# Patient Record
Sex: Female | Born: 1960 | Race: Black or African American | Hispanic: No | Smoking: Former smoker
Health system: Southern US, Community
[De-identification: ages and names within clinical notes are randomized; demographics above are authoritative.]

---

## 2010-04-06 ENCOUNTER — Encounter: Admission: RE | Admit: 2010-04-06 | Discharge: 2010-05-20 | Payer: Self-pay | Admitting: Orthopedic Surgery

## 2010-06-28 ENCOUNTER — Encounter: Payer: Self-pay | Admitting: Orthopedic Surgery

## 2010-06-30 ENCOUNTER — Inpatient Hospital Stay (HOSPITAL_COMMUNITY): Admission: RE | Admit: 2010-06-30 | Discharge: 2010-07-01 | Payer: Self-pay | Admitting: *Deleted

## 2010-11-02 LAB — COMPREHENSIVE METABOLIC PANEL
ALT: 70 U/L — ABNORMAL HIGH (ref 0–35)
AST: 29 U/L (ref 0–37)
Alkaline Phosphatase: 145 U/L — ABNORMAL HIGH (ref 39–117)
CO2: 29 mEq/L (ref 19–32)
Chloride: 108 mEq/L (ref 96–112)
GFR calc Af Amer: >60 mL/min (ref >60)
GFR calc non Af Amer: 56 mL/min — ABNORMAL LOW (ref >60)
Potassium: 3.8 mEq/L (ref 3.5–5.1)
Sodium: 143 mEq/L (ref 135–145)
Total Bilirubin: 0.4 mg/dL (ref 0.3–1.2)

## 2010-11-02 LAB — HEPATIC FUNCTION PANEL
Indirect Bilirubin: 0.3 mg/dL (ref 0.3–0.9)
Total Protein: 5.9 g/dL — ABNORMAL LOW (ref 6.0–8.3)

## 2010-11-02 LAB — SURGICAL PCR SCREEN
MRSA, PCR: NEGATIVE
Staphylococcus aureus: NEGATIVE

## 2010-11-02 LAB — DIFFERENTIAL
Basophils Absolute: 0 10*3/uL (ref 0.0–0.1)
Eosinophils Absolute: 0.4 10*3/uL (ref 0.0–0.7)
Eosinophils Relative: 6 % — ABNORMAL HIGH (ref 0–5)
Monocytes Absolute: 0.5 10*3/uL (ref 0.1–1.0)

## 2010-11-02 LAB — URINALYSIS, ROUTINE W REFLEX MICROSCOPIC
Ketones, ur: NEGATIVE mg/dL
Nitrite: NEGATIVE
Specific Gravity, Urine: 1.031 — ABNORMAL HIGH (ref 1.005–1.030)
pH: 5.5 (ref 5.0–8.0)

## 2010-11-02 LAB — CBC
Hemoglobin: 12.3 g/dL (ref 12.0–15.0)
MCH: 28.7 pg (ref 26.0–34.0)
MCV: 89.5 fL (ref 78.0–100.0)
Platelets: 177 10*3/uL (ref 150–400)
RBC: 3.14 MIL/uL — ABNORMAL LOW (ref 3.87–5.11)
RBC: 4.29 MIL/uL (ref 3.87–5.11)
WBC: 7 10*3/uL (ref 4.0–10.5)
WBC: 7.7 10*3/uL (ref 4.0–10.5)

## 2010-11-02 LAB — TYPE AND SCREEN: Antibody Screen: NEGATIVE

## 2010-11-02 LAB — POCT I-STAT 4, (NA,K, GLUC, HGB,HCT): Glucose, Bld: 104 mg/dL — ABNORMAL HIGH (ref 70–99)

## 2010-11-02 LAB — APTT: aPTT: 28 seconds (ref 24–37)

## 2010-11-02 LAB — PROTIME-INR: Prothrombin Time: 14 seconds (ref 11.6–15.2)

## 2010-11-15 ENCOUNTER — Emergency Department (HOSPITAL_COMMUNITY)
Admission: EM | Admit: 2010-11-15 | Discharge: 2010-11-15 | Disposition: A | Discharge status: Home / Self Care | Payer: Medicaid Other | Attending: General Surgery | Admitting: General Surgery

## 2010-11-15 ENCOUNTER — Emergency Department (HOSPITAL_COMMUNITY): Payer: Medicaid Other

## 2010-11-15 DIAGNOSIS — S21209A Unspecified open wound of unspecified back wall of thorax without penetration into thoracic cavity, initial encounter: Secondary | ICD-10-CM | POA: Insufficient documentation

## 2010-11-15 DIAGNOSIS — S0003XA Contusion of scalp, initial encounter: Secondary | ICD-10-CM | POA: Insufficient documentation

## 2010-11-15 DIAGNOSIS — S1190XA Unspecified open wound of unspecified part of neck, initial encounter: Secondary | ICD-10-CM | POA: Insufficient documentation

## 2010-11-15 DIAGNOSIS — S0083XA Contusion of other part of head, initial encounter: Secondary | ICD-10-CM | POA: Insufficient documentation

## 2010-11-15 DIAGNOSIS — S0990XA Unspecified injury of head, initial encounter: Secondary | ICD-10-CM | POA: Insufficient documentation

## 2010-11-15 LAB — ABO/RH: ABO/RH(D): A POS

## 2010-11-15 LAB — COMPREHENSIVE METABOLIC PANEL
ALT: 20 U/L (ref 0–35)
Alkaline Phosphatase: 141 U/L — ABNORMAL HIGH (ref 39–117)
BUN: 18 mg/dL (ref 6–23)
CO2: 28 meq/L (ref 19–32)
Chloride: 102 meq/L (ref 96–112)
Glucose, Bld: 102 mg/dL — ABNORMAL HIGH (ref 70–99)
Potassium: 3.5 meq/L (ref 3.5–5.1)
Total Bilirubin: 0.4 mg/dL (ref 0.3–1.2)

## 2010-11-15 LAB — CBC
HCT: 37.4 % (ref 36.0–46.0)
MCH: 27 pg (ref 26.0–34.0)
MCHC: 32.6 g/dL (ref 30.0–36.0)
MCV: 82.7 fL (ref 78.0–100.0)
RDW: 15.9 % — ABNORMAL HIGH (ref 11.5–15.5)

## 2010-11-15 LAB — POCT I-STAT, CHEM 8
BUN: 20 mg/dL (ref 6–23)
Calcium, Ion: 1.12 mmol/L (ref 1.12–1.32)
Chloride: 103 meq/L (ref 96–112)
Creatinine, Ser: 1.3 mg/dL — ABNORMAL HIGH (ref 0.4–1.2)
Glucose, Bld: 96 mg/dL (ref 70–99)
HCT: 39 % (ref 36.0–46.0)

## 2010-11-16 LAB — TYPE AND SCREEN
ABO/RH(D): A POS
Antibody Screen: NEGATIVE
Unit division: 0

## 2010-11-18 ENCOUNTER — Other Ambulatory Visit: Payer: Self-pay | Admitting: General Surgery

## 2010-11-18 DIAGNOSIS — E079 Disorder of thyroid, unspecified: Secondary | ICD-10-CM
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
## 2010-11-24 ENCOUNTER — Ambulatory Visit
Admission: RE | Admit: 2010-11-24 | Discharge: 2010-11-24 | Disposition: A | Discharge status: Home / Self Care | Payer: No Typology Code available for payment source | Source: Ambulatory Visit | Attending: General Surgery | Admitting: General Surgery

## 2010-11-24 DIAGNOSIS — E079 Disorder of thyroid, unspecified: Secondary | ICD-10-CM

## 2010-12-03 ENCOUNTER — Other Ambulatory Visit: Payer: Self-pay | Admitting: General Surgery

## 2010-12-03 DIAGNOSIS — E079 Disorder of thyroid, unspecified: Secondary | ICD-10-CM

## 2010-12-07 ENCOUNTER — Other Ambulatory Visit (HOSPITAL_COMMUNITY)
Admission: RE | Admit: 2010-12-07 | Discharge: 2010-12-07 | Disposition: A | Discharge status: Home / Self Care | Payer: Medicaid Other | Source: Ambulatory Visit | Attending: Interventional Radiology | Admitting: Interventional Radiology

## 2010-12-07 ENCOUNTER — Other Ambulatory Visit: Payer: Self-pay | Admitting: Interventional Radiology

## 2010-12-07 ENCOUNTER — Ambulatory Visit
Admission: RE | Admit: 2010-12-07 | Discharge: 2010-12-07 | Disposition: A | Discharge status: Home / Self Care | Payer: No Typology Code available for payment source | Source: Ambulatory Visit | Attending: General Surgery | Admitting: General Surgery

## 2010-12-07 DIAGNOSIS — E079 Disorder of thyroid, unspecified: Secondary | ICD-10-CM

## 2010-12-07 DIAGNOSIS — E049 Nontoxic goiter, unspecified: Secondary | ICD-10-CM | POA: Insufficient documentation
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
## 2013-02-03 IMAGING — US US THYROID BIOPSY
1 series · 6 of 6 positions shown · non-contrast
Comparison: none

CLINICAL DATA: Dominant right thyroid cyst.

ULTRASOUND-GUIDED THYROID ASPIRATION BIOPSY
TECHNIQUE: Survey ultrasound was performed and the dominant lesion
in the inferior right lobe was localized.  An appropriate skin
entry site was determined.  Skin was marked, then prepped with
Betadine, draped in usual sterile fashion, and infiltrated locally
with 1% lidocaine.  Under real-time ultrasound guidance, 4  passes
were made into the lesion with 18 and 25 gauge needles sampling
solid and cystic components.  The patient tolerated procedure well,
with no immediate complications.
IMPRESSION
1.  Technically successful ultrasound-guided thyroid aspiration
biopsy

[Series 1: us thyroid biopsy · 0.08mm/px · 6 acquisitions, 6 frames shown]
[im 1/6]
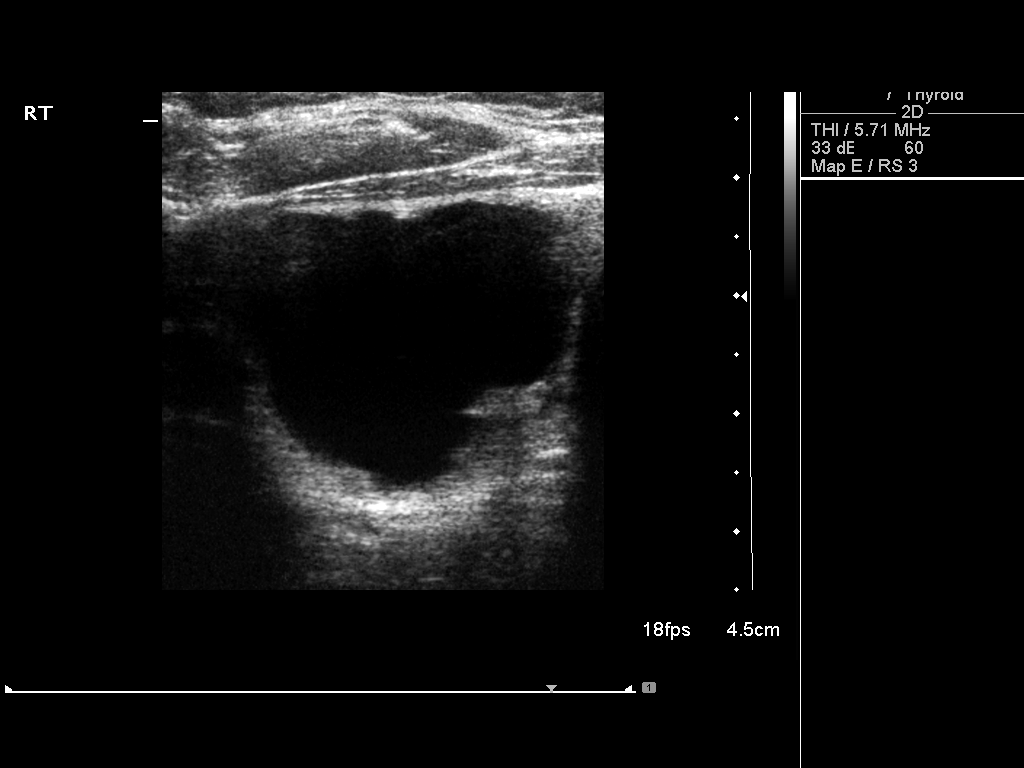
[im 2/6]
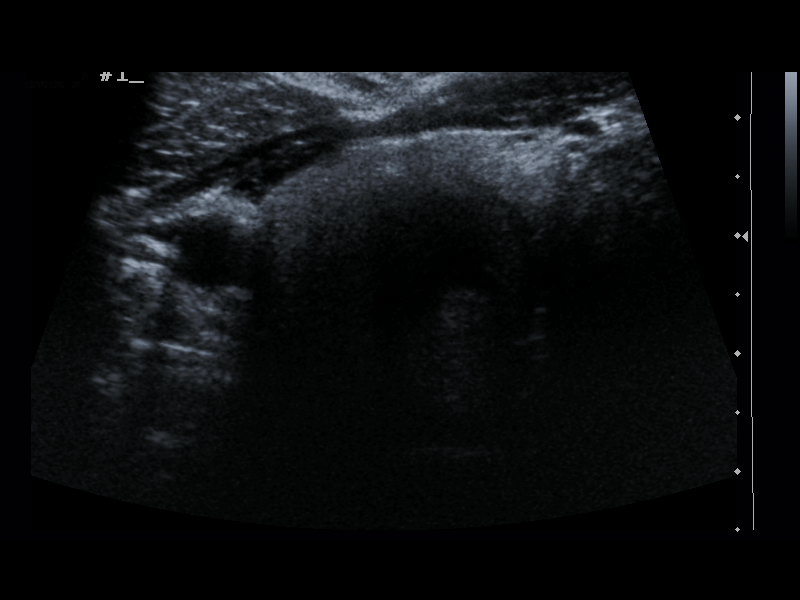
[im 3/6]
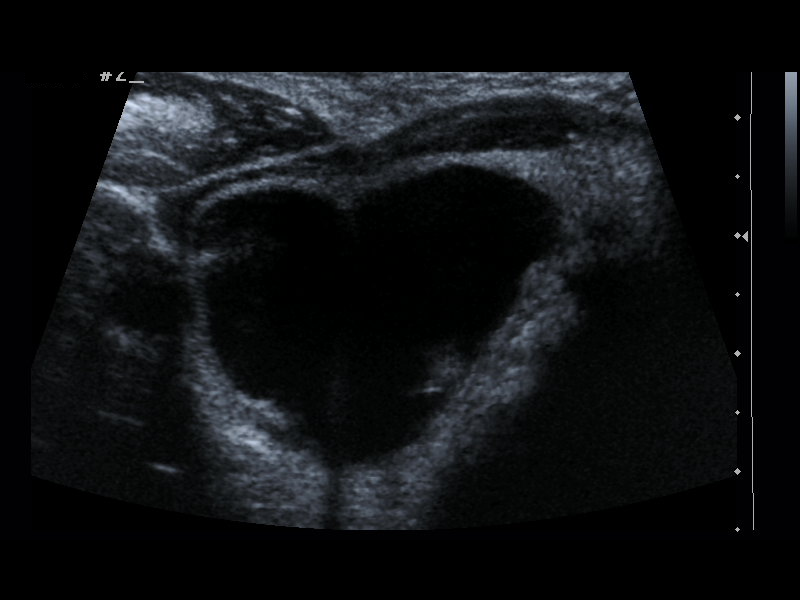
[im 4/6]
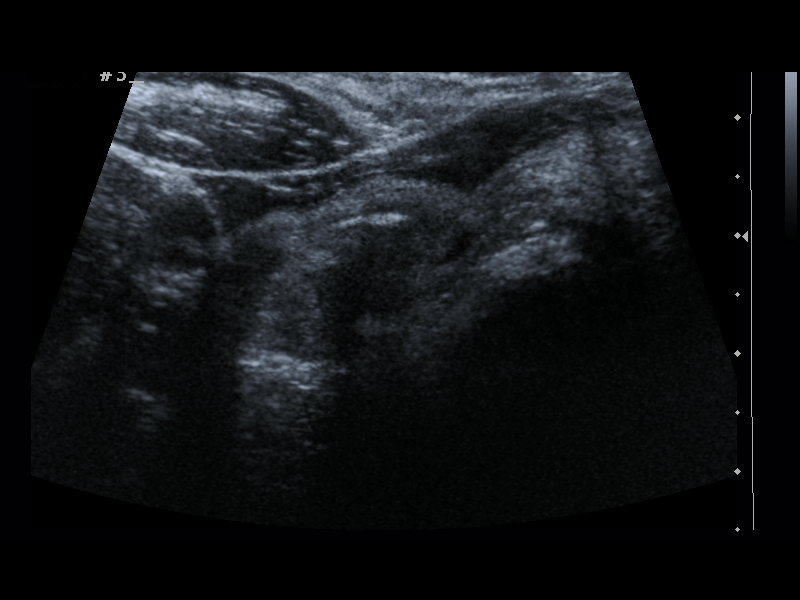
[im 5/6]
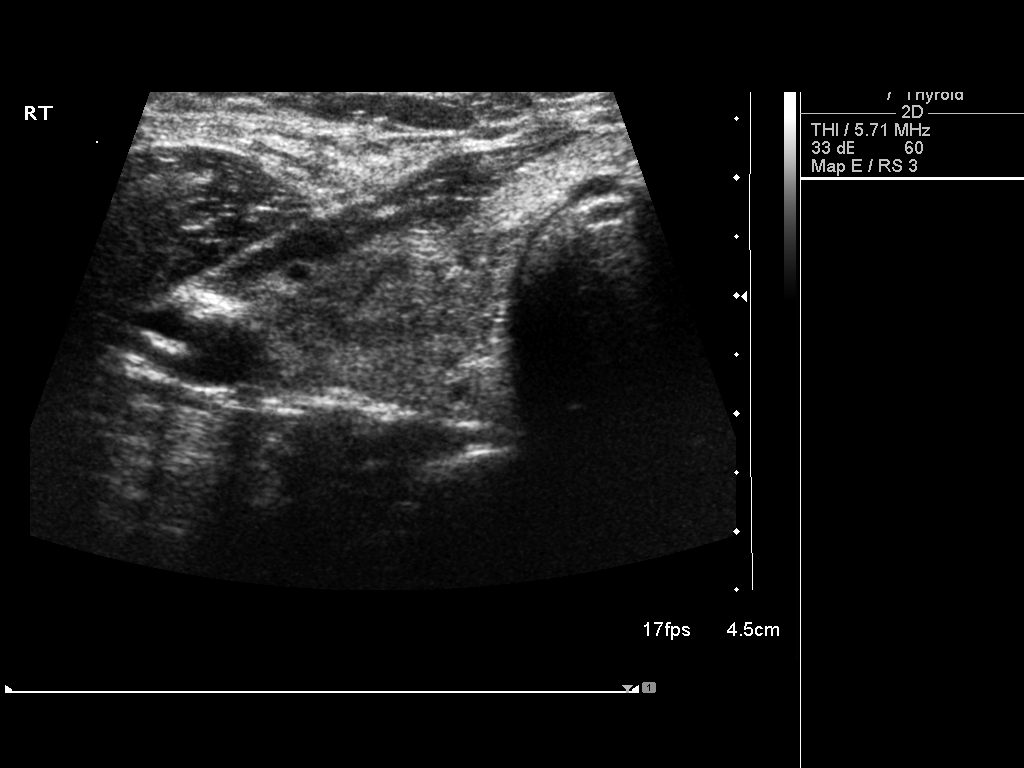
[im 6/6]
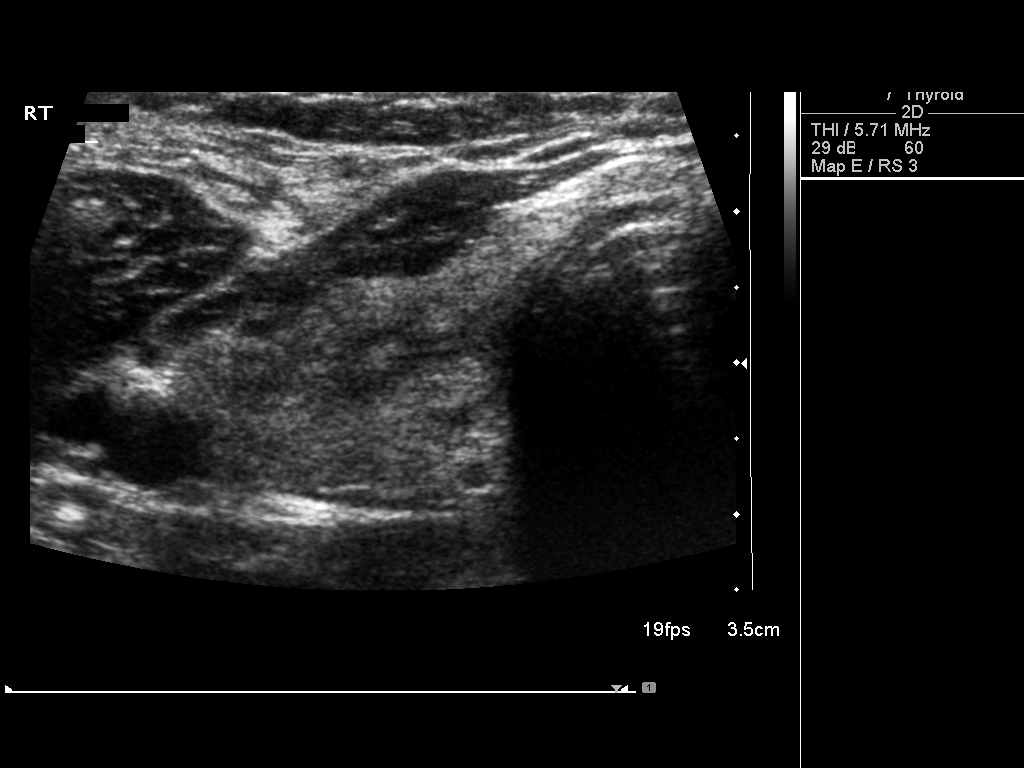

[6 of 6 positions shown; findings below may reference images not displayed]

## 2015-09-28 DIAGNOSIS — I10 Essential (primary) hypertension: Secondary | ICD-10-CM | POA: Diagnosis not present

## 2015-09-28 DIAGNOSIS — Z0000 Encounter for general adult medical examination without abnormal findings: Secondary | ICD-10-CM | POA: Diagnosis not present

## 2015-09-28 DIAGNOSIS — N182 Chronic kidney disease, stage 2 (mild): Secondary | ICD-10-CM | POA: Diagnosis not present

## 2015-09-28 DIAGNOSIS — M545 Low back pain: Secondary | ICD-10-CM | POA: Diagnosis not present

## 2015-10-28 DIAGNOSIS — Z780 Asymptomatic menopausal state: Secondary | ICD-10-CM | POA: Diagnosis not present

## 2015-10-28 DIAGNOSIS — Z79899 Other long term (current) drug therapy: Secondary | ICD-10-CM | POA: Diagnosis not present

## 2015-10-28 DIAGNOSIS — I10 Essential (primary) hypertension: Secondary | ICD-10-CM | POA: Diagnosis not present

## 2015-10-28 DIAGNOSIS — M810 Age-related osteoporosis without current pathological fracture: Secondary | ICD-10-CM | POA: Diagnosis not present

## 2015-10-28 DIAGNOSIS — Z13820 Encounter for screening for osteoporosis: Secondary | ICD-10-CM | POA: Diagnosis not present

## 2015-12-03 DIAGNOSIS — Z0000 Encounter for general adult medical examination without abnormal findings: Secondary | ICD-10-CM | POA: Diagnosis not present

## 2015-12-03 DIAGNOSIS — I10 Essential (primary) hypertension: Secondary | ICD-10-CM | POA: Diagnosis not present

## 2015-12-03 DIAGNOSIS — Z1389 Encounter for screening for other disorder: Secondary | ICD-10-CM | POA: Diagnosis not present

## 2015-12-03 DIAGNOSIS — M545 Low back pain: Secondary | ICD-10-CM | POA: Diagnosis not present

## 2015-12-03 DIAGNOSIS — N182 Chronic kidney disease, stage 2 (mild): Secondary | ICD-10-CM | POA: Diagnosis not present

## 2015-12-13 DIAGNOSIS — R109 Unspecified abdominal pain: Secondary | ICD-10-CM | POA: Diagnosis not present

## 2015-12-13 DIAGNOSIS — Z79899 Other long term (current) drug therapy: Secondary | ICD-10-CM | POA: Diagnosis not present

## 2015-12-13 DIAGNOSIS — M1990 Unspecified osteoarthritis, unspecified site: Secondary | ICD-10-CM | POA: Diagnosis not present

## 2015-12-13 DIAGNOSIS — M25569 Pain in unspecified knee: Secondary | ICD-10-CM | POA: Diagnosis not present

## 2015-12-13 DIAGNOSIS — G8929 Other chronic pain: Secondary | ICD-10-CM | POA: Diagnosis not present

## 2015-12-13 DIAGNOSIS — I10 Essential (primary) hypertension: Secondary | ICD-10-CM | POA: Diagnosis not present

## 2015-12-13 DIAGNOSIS — Z79891 Long term (current) use of opiate analgesic: Secondary | ICD-10-CM | POA: Diagnosis not present

## 2015-12-15 DIAGNOSIS — Z1231 Encounter for screening mammogram for malignant neoplasm of breast: Secondary | ICD-10-CM | POA: Diagnosis not present

## 2016-02-02 DIAGNOSIS — N182 Chronic kidney disease, stage 2 (mild): Secondary | ICD-10-CM | POA: Diagnosis not present

## 2016-02-02 DIAGNOSIS — M545 Low back pain: Secondary | ICD-10-CM | POA: Diagnosis not present

## 2016-02-02 DIAGNOSIS — I872 Venous insufficiency (chronic) (peripheral): Secondary | ICD-10-CM | POA: Diagnosis not present

## 2016-02-02 DIAGNOSIS — I10 Essential (primary) hypertension: Secondary | ICD-10-CM | POA: Diagnosis not present

## 2016-02-02 DIAGNOSIS — Z6841 Body Mass Index (BMI) 40.0 and over, adult: Secondary | ICD-10-CM | POA: Diagnosis not present

## 2016-03-28 DIAGNOSIS — H04123 Dry eye syndrome of bilateral lacrimal glands: Secondary | ICD-10-CM | POA: Diagnosis not present

## 2016-03-28 DIAGNOSIS — H524 Presbyopia: Secondary | ICD-10-CM | POA: Diagnosis not present

## 2016-03-28 DIAGNOSIS — H1045 Other chronic allergic conjunctivitis: Secondary | ICD-10-CM | POA: Diagnosis not present

## 2016-03-28 DIAGNOSIS — H18893 Other specified disorders of cornea, bilateral: Secondary | ICD-10-CM | POA: Diagnosis not present

## 2016-03-28 DIAGNOSIS — H40013 Open angle with borderline findings, low risk, bilateral: Secondary | ICD-10-CM | POA: Diagnosis not present

## 2016-04-04 DIAGNOSIS — N182 Chronic kidney disease, stage 2 (mild): Secondary | ICD-10-CM | POA: Diagnosis not present

## 2016-04-04 DIAGNOSIS — I10 Essential (primary) hypertension: Secondary | ICD-10-CM | POA: Diagnosis not present

## 2016-04-04 DIAGNOSIS — M545 Low back pain: Secondary | ICD-10-CM | POA: Diagnosis not present

## 2016-04-28 DIAGNOSIS — I10 Essential (primary) hypertension: Secondary | ICD-10-CM | POA: Diagnosis not present

## 2016-04-28 DIAGNOSIS — M545 Low back pain: Secondary | ICD-10-CM | POA: Diagnosis not present

## 2016-04-28 DIAGNOSIS — N182 Chronic kidney disease, stage 2 (mild): Secondary | ICD-10-CM | POA: Diagnosis not present

## 2016-06-06 DIAGNOSIS — N182 Chronic kidney disease, stage 2 (mild): Secondary | ICD-10-CM | POA: Diagnosis not present

## 2016-06-06 DIAGNOSIS — M545 Low back pain: Secondary | ICD-10-CM | POA: Diagnosis not present

## 2016-06-06 DIAGNOSIS — I10 Essential (primary) hypertension: Secondary | ICD-10-CM | POA: Diagnosis not present

## 2016-06-22 DIAGNOSIS — I10 Essential (primary) hypertension: Secondary | ICD-10-CM | POA: Diagnosis not present

## 2016-06-22 DIAGNOSIS — N182 Chronic kidney disease, stage 2 (mild): Secondary | ICD-10-CM | POA: Diagnosis not present

## 2016-07-03 DIAGNOSIS — Z79899 Other long term (current) drug therapy: Secondary | ICD-10-CM | POA: Diagnosis not present

## 2016-07-03 DIAGNOSIS — R600 Localized edema: Secondary | ICD-10-CM | POA: Diagnosis not present

## 2016-07-22 DIAGNOSIS — N182 Chronic kidney disease, stage 2 (mild): Secondary | ICD-10-CM | POA: Diagnosis not present

## 2016-07-22 DIAGNOSIS — I10 Essential (primary) hypertension: Secondary | ICD-10-CM | POA: Diagnosis not present

## 2016-08-05 DIAGNOSIS — N182 Chronic kidney disease, stage 2 (mild): Secondary | ICD-10-CM | POA: Diagnosis not present

## 2016-08-05 DIAGNOSIS — M545 Low back pain: Secondary | ICD-10-CM | POA: Diagnosis not present

## 2016-08-05 DIAGNOSIS — I10 Essential (primary) hypertension: Secondary | ICD-10-CM | POA: Diagnosis not present

## 2016-09-01 DIAGNOSIS — N182 Chronic kidney disease, stage 2 (mild): Secondary | ICD-10-CM | POA: Diagnosis not present

## 2016-09-01 DIAGNOSIS — M545 Low back pain: Secondary | ICD-10-CM | POA: Diagnosis not present

## 2016-09-01 DIAGNOSIS — I10 Essential (primary) hypertension: Secondary | ICD-10-CM | POA: Diagnosis not present

## 2016-09-27 DIAGNOSIS — M545 Low back pain: Secondary | ICD-10-CM | POA: Diagnosis not present

## 2016-09-27 DIAGNOSIS — I10 Essential (primary) hypertension: Secondary | ICD-10-CM | POA: Diagnosis not present

## 2016-09-27 DIAGNOSIS — N182 Chronic kidney disease, stage 2 (mild): Secondary | ICD-10-CM | POA: Diagnosis not present

## 2016-10-11 DIAGNOSIS — Z1389 Encounter for screening for other disorder: Secondary | ICD-10-CM | POA: Diagnosis not present

## 2016-10-11 DIAGNOSIS — Z0000 Encounter for general adult medical examination without abnormal findings: Secondary | ICD-10-CM | POA: Diagnosis not present

## 2016-10-11 DIAGNOSIS — M545 Low back pain: Secondary | ICD-10-CM | POA: Diagnosis not present

## 2016-10-11 DIAGNOSIS — N182 Chronic kidney disease, stage 2 (mild): Secondary | ICD-10-CM | POA: Diagnosis not present

## 2016-10-11 DIAGNOSIS — I10 Essential (primary) hypertension: Secondary | ICD-10-CM | POA: Diagnosis not present

## 2016-10-27 DIAGNOSIS — N182 Chronic kidney disease, stage 2 (mild): Secondary | ICD-10-CM | POA: Diagnosis not present

## 2016-10-27 DIAGNOSIS — I10 Essential (primary) hypertension: Secondary | ICD-10-CM | POA: Diagnosis not present

## 2016-12-05 DIAGNOSIS — I10 Essential (primary) hypertension: Secondary | ICD-10-CM | POA: Diagnosis not present

## 2016-12-05 DIAGNOSIS — N182 Chronic kidney disease, stage 2 (mild): Secondary | ICD-10-CM | POA: Diagnosis not present

## 2016-12-06 DIAGNOSIS — I10 Essential (primary) hypertension: Secondary | ICD-10-CM | POA: Diagnosis not present

## 2016-12-06 DIAGNOSIS — M545 Low back pain: Secondary | ICD-10-CM | POA: Diagnosis not present

## 2016-12-06 DIAGNOSIS — N182 Chronic kidney disease, stage 2 (mild): Secondary | ICD-10-CM | POA: Diagnosis not present

## 2016-12-06 DIAGNOSIS — Z0000 Encounter for general adult medical examination without abnormal findings: Secondary | ICD-10-CM | POA: Diagnosis not present

## 2016-12-22 DIAGNOSIS — I10 Essential (primary) hypertension: Secondary | ICD-10-CM | POA: Diagnosis not present

## 2016-12-22 DIAGNOSIS — N182 Chronic kidney disease, stage 2 (mild): Secondary | ICD-10-CM | POA: Diagnosis not present

## 2017-01-08 DIAGNOSIS — Z79899 Other long term (current) drug therapy: Secondary | ICD-10-CM | POA: Diagnosis not present

## 2017-01-08 DIAGNOSIS — I10 Essential (primary) hypertension: Secondary | ICD-10-CM | POA: Diagnosis not present

## 2017-01-08 DIAGNOSIS — R05 Cough: Secondary | ICD-10-CM | POA: Diagnosis not present

## 2017-01-08 DIAGNOSIS — J209 Acute bronchitis, unspecified: Secondary | ICD-10-CM | POA: Diagnosis not present

## 2017-01-08 DIAGNOSIS — Z87891 Personal history of nicotine dependence: Secondary | ICD-10-CM | POA: Diagnosis not present

## 2017-01-08 DIAGNOSIS — Z8249 Family history of ischemic heart disease and other diseases of the circulatory system: Secondary | ICD-10-CM | POA: Diagnosis not present

## 2017-01-08 DIAGNOSIS — M1990 Unspecified osteoarthritis, unspecified site: Secondary | ICD-10-CM | POA: Diagnosis not present

## 2017-02-06 DIAGNOSIS — I10 Essential (primary) hypertension: Secondary | ICD-10-CM | POA: Diagnosis not present

## 2017-02-06 DIAGNOSIS — N182 Chronic kidney disease, stage 2 (mild): Secondary | ICD-10-CM | POA: Diagnosis not present

## 2017-02-06 DIAGNOSIS — M545 Low back pain: Secondary | ICD-10-CM | POA: Diagnosis not present

## 2017-03-10 DIAGNOSIS — I10 Essential (primary) hypertension: Secondary | ICD-10-CM | POA: Diagnosis not present

## 2017-03-10 DIAGNOSIS — N182 Chronic kidney disease, stage 2 (mild): Secondary | ICD-10-CM | POA: Diagnosis not present

## 2017-03-19 DIAGNOSIS — Z96651 Presence of right artificial knee joint: Secondary | ICD-10-CM | POA: Diagnosis not present

## 2017-03-19 DIAGNOSIS — Z79899 Other long term (current) drug therapy: Secondary | ICD-10-CM | POA: Diagnosis not present

## 2017-03-19 DIAGNOSIS — S0003XA Contusion of scalp, initial encounter: Secondary | ICD-10-CM | POA: Diagnosis not present

## 2017-03-19 DIAGNOSIS — R42 Dizziness and giddiness: Secondary | ICD-10-CM | POA: Diagnosis not present

## 2017-03-19 DIAGNOSIS — I10 Essential (primary) hypertension: Secondary | ICD-10-CM | POA: Diagnosis not present

## 2017-03-19 DIAGNOSIS — R51 Headache: Secondary | ICD-10-CM | POA: Diagnosis not present

## 2017-03-19 DIAGNOSIS — W1789XA Other fall from one level to another, initial encounter: Secondary | ICD-10-CM | POA: Diagnosis not present

## 2017-03-19 DIAGNOSIS — D509 Iron deficiency anemia, unspecified: Secondary | ICD-10-CM | POA: Diagnosis not present

## 2017-03-19 DIAGNOSIS — M25569 Pain in unspecified knee: Secondary | ICD-10-CM | POA: Diagnosis not present

## 2017-03-22 DIAGNOSIS — I10 Essential (primary) hypertension: Secondary | ICD-10-CM | POA: Diagnosis not present

## 2017-03-22 DIAGNOSIS — N182 Chronic kidney disease, stage 2 (mild): Secondary | ICD-10-CM | POA: Diagnosis not present

## 2017-03-22 DIAGNOSIS — M545 Low back pain: Secondary | ICD-10-CM | POA: Diagnosis not present

## 2017-04-07 DIAGNOSIS — N182 Chronic kidney disease, stage 2 (mild): Secondary | ICD-10-CM | POA: Diagnosis not present

## 2017-04-07 DIAGNOSIS — I10 Essential (primary) hypertension: Secondary | ICD-10-CM | POA: Diagnosis not present

## 2017-05-01 DIAGNOSIS — N182 Chronic kidney disease, stage 2 (mild): Secondary | ICD-10-CM | POA: Diagnosis not present

## 2017-05-01 DIAGNOSIS — I10 Essential (primary) hypertension: Secondary | ICD-10-CM | POA: Diagnosis not present

## 2017-05-09 DIAGNOSIS — K2900 Acute gastritis without bleeding: Secondary | ICD-10-CM | POA: Diagnosis not present

## 2017-06-21 DIAGNOSIS — N182 Chronic kidney disease, stage 2 (mild): Secondary | ICD-10-CM | POA: Diagnosis not present

## 2017-06-21 DIAGNOSIS — I10 Essential (primary) hypertension: Secondary | ICD-10-CM | POA: Diagnosis not present

## 2017-06-21 DIAGNOSIS — M545 Low back pain: Secondary | ICD-10-CM | POA: Diagnosis not present

## 2017-07-31 DIAGNOSIS — M545 Low back pain: Secondary | ICD-10-CM | POA: Diagnosis not present

## 2017-07-31 DIAGNOSIS — N182 Chronic kidney disease, stage 2 (mild): Secondary | ICD-10-CM | POA: Diagnosis not present

## 2017-07-31 DIAGNOSIS — I10 Essential (primary) hypertension: Secondary | ICD-10-CM | POA: Diagnosis not present

## 2017-08-21 DIAGNOSIS — M545 Low back pain: Secondary | ICD-10-CM | POA: Diagnosis not present

## 2017-08-21 DIAGNOSIS — N182 Chronic kidney disease, stage 2 (mild): Secondary | ICD-10-CM | POA: Diagnosis not present

## 2017-08-21 DIAGNOSIS — I10 Essential (primary) hypertension: Secondary | ICD-10-CM | POA: Diagnosis not present

## 2017-09-15 DIAGNOSIS — I10 Essential (primary) hypertension: Secondary | ICD-10-CM | POA: Diagnosis not present

## 2017-09-15 DIAGNOSIS — M545 Low back pain: Secondary | ICD-10-CM | POA: Diagnosis not present

## 2017-09-15 DIAGNOSIS — N182 Chronic kidney disease, stage 2 (mild): Secondary | ICD-10-CM | POA: Diagnosis not present

## 2017-09-27 DIAGNOSIS — M545 Low back pain: Secondary | ICD-10-CM | POA: Diagnosis not present

## 2017-09-27 DIAGNOSIS — N182 Chronic kidney disease, stage 2 (mild): Secondary | ICD-10-CM | POA: Diagnosis not present

## 2017-09-27 DIAGNOSIS — I10 Essential (primary) hypertension: Secondary | ICD-10-CM | POA: Diagnosis not present

## 2017-10-26 DIAGNOSIS — Z1389 Encounter for screening for other disorder: Secondary | ICD-10-CM | POA: Diagnosis not present

## 2017-10-26 DIAGNOSIS — M545 Low back pain: Secondary | ICD-10-CM | POA: Diagnosis not present

## 2017-10-26 DIAGNOSIS — N182 Chronic kidney disease, stage 2 (mild): Secondary | ICD-10-CM | POA: Diagnosis not present

## 2017-10-26 DIAGNOSIS — I10 Essential (primary) hypertension: Secondary | ICD-10-CM | POA: Diagnosis not present

## 2017-10-26 DIAGNOSIS — Z0000 Encounter for general adult medical examination without abnormal findings: Secondary | ICD-10-CM | POA: Diagnosis not present

## 2017-10-30 DIAGNOSIS — I10 Essential (primary) hypertension: Secondary | ICD-10-CM | POA: Diagnosis not present

## 2017-10-30 DIAGNOSIS — M545 Low back pain: Secondary | ICD-10-CM | POA: Diagnosis not present

## 2017-10-30 DIAGNOSIS — N182 Chronic kidney disease, stage 2 (mild): Secondary | ICD-10-CM | POA: Diagnosis not present

## 2017-12-01 DIAGNOSIS — N182 Chronic kidney disease, stage 2 (mild): Secondary | ICD-10-CM | POA: Diagnosis not present

## 2017-12-01 DIAGNOSIS — I10 Essential (primary) hypertension: Secondary | ICD-10-CM | POA: Diagnosis not present

## 2017-12-01 DIAGNOSIS — M545 Low back pain: Secondary | ICD-10-CM | POA: Diagnosis not present

## 2017-12-05 DIAGNOSIS — K122 Cellulitis and abscess of mouth: Secondary | ICD-10-CM | POA: Diagnosis not present

## 2017-12-26 DIAGNOSIS — M545 Low back pain: Secondary | ICD-10-CM | POA: Diagnosis not present

## 2017-12-26 DIAGNOSIS — K122 Cellulitis and abscess of mouth: Secondary | ICD-10-CM | POA: Diagnosis not present

## 2017-12-26 DIAGNOSIS — G43709 Chronic migraine without aura, not intractable, without status migrainosus: Secondary | ICD-10-CM | POA: Diagnosis not present

## 2018-01-02 DIAGNOSIS — M545 Low back pain: Secondary | ICD-10-CM | POA: Diagnosis not present

## 2018-01-02 DIAGNOSIS — N182 Chronic kidney disease, stage 2 (mild): Secondary | ICD-10-CM | POA: Diagnosis not present

## 2018-01-02 DIAGNOSIS — I10 Essential (primary) hypertension: Secondary | ICD-10-CM | POA: Diagnosis not present

## 2018-01-24 DIAGNOSIS — I10 Essential (primary) hypertension: Secondary | ICD-10-CM | POA: Diagnosis not present

## 2018-01-24 DIAGNOSIS — N182 Chronic kidney disease, stage 2 (mild): Secondary | ICD-10-CM | POA: Diagnosis not present

## 2018-01-24 DIAGNOSIS — M545 Low back pain: Secondary | ICD-10-CM | POA: Diagnosis not present

## 2018-02-02 DIAGNOSIS — H04121 Dry eye syndrome of right lacrimal gland: Secondary | ICD-10-CM | POA: Diagnosis not present

## 2018-02-02 DIAGNOSIS — H5213 Myopia, bilateral: Secondary | ICD-10-CM | POA: Diagnosis not present

## 2018-02-27 DIAGNOSIS — M545 Low back pain: Secondary | ICD-10-CM | POA: Diagnosis not present

## 2018-02-27 DIAGNOSIS — G43709 Chronic migraine without aura, not intractable, without status migrainosus: Secondary | ICD-10-CM | POA: Diagnosis not present

## 2018-03-15 DIAGNOSIS — K130 Diseases of lips: Secondary | ICD-10-CM | POA: Diagnosis not present

## 2018-03-16 DIAGNOSIS — I10 Essential (primary) hypertension: Secondary | ICD-10-CM | POA: Diagnosis not present

## 2018-03-16 DIAGNOSIS — N182 Chronic kidney disease, stage 2 (mild): Secondary | ICD-10-CM | POA: Diagnosis not present

## 2018-03-16 DIAGNOSIS — M545 Low back pain: Secondary | ICD-10-CM | POA: Diagnosis not present

## 2018-04-02 DIAGNOSIS — Z1231 Encounter for screening mammogram for malignant neoplasm of breast: Secondary | ICD-10-CM | POA: Diagnosis not present

## 2018-04-30 DIAGNOSIS — G43709 Chronic migraine without aura, not intractable, without status migrainosus: Secondary | ICD-10-CM | POA: Diagnosis not present

## 2018-04-30 DIAGNOSIS — I10 Essential (primary) hypertension: Secondary | ICD-10-CM | POA: Diagnosis not present

## 2018-04-30 DIAGNOSIS — M545 Low back pain: Secondary | ICD-10-CM | POA: Diagnosis not present

## 2018-04-30 DIAGNOSIS — Z0000 Encounter for general adult medical examination without abnormal findings: Secondary | ICD-10-CM | POA: Diagnosis not present

## 2018-06-11 DIAGNOSIS — I10 Essential (primary) hypertension: Secondary | ICD-10-CM | POA: Diagnosis not present

## 2018-06-11 DIAGNOSIS — G43709 Chronic migraine without aura, not intractable, without status migrainosus: Secondary | ICD-10-CM | POA: Diagnosis not present

## 2018-06-11 DIAGNOSIS — M545 Low back pain: Secondary | ICD-10-CM | POA: Diagnosis not present

## 2018-06-28 DIAGNOSIS — M545 Low back pain: Secondary | ICD-10-CM | POA: Diagnosis not present

## 2018-06-28 DIAGNOSIS — G43709 Chronic migraine without aura, not intractable, without status migrainosus: Secondary | ICD-10-CM | POA: Diagnosis not present

## 2018-06-28 DIAGNOSIS — I10 Essential (primary) hypertension: Secondary | ICD-10-CM | POA: Diagnosis not present

## 2018-06-28 DIAGNOSIS — G5601 Carpal tunnel syndrome, right upper limb: Secondary | ICD-10-CM | POA: Diagnosis not present

## 2018-07-13 DIAGNOSIS — I10 Essential (primary) hypertension: Secondary | ICD-10-CM | POA: Diagnosis not present

## 2018-07-13 DIAGNOSIS — Z79899 Other long term (current) drug therapy: Secondary | ICD-10-CM | POA: Diagnosis not present

## 2018-07-17 DIAGNOSIS — I10 Essential (primary) hypertension: Secondary | ICD-10-CM | POA: Diagnosis not present

## 2018-07-17 DIAGNOSIS — M545 Low back pain: Secondary | ICD-10-CM | POA: Diagnosis not present

## 2018-08-28 DIAGNOSIS — Z1389 Encounter for screening for other disorder: Secondary | ICD-10-CM | POA: Diagnosis not present

## 2018-08-28 DIAGNOSIS — G43709 Chronic migraine without aura, not intractable, without status migrainosus: Secondary | ICD-10-CM | POA: Diagnosis not present

## 2018-08-28 DIAGNOSIS — I10 Essential (primary) hypertension: Secondary | ICD-10-CM | POA: Diagnosis not present

## 2018-08-28 DIAGNOSIS — M545 Low back pain: Secondary | ICD-10-CM | POA: Diagnosis not present

## 2018-08-28 DIAGNOSIS — Z0000 Encounter for general adult medical examination without abnormal findings: Secondary | ICD-10-CM | POA: Diagnosis not present

## 2018-10-08 ENCOUNTER — Other Ambulatory Visit: Payer: Self-pay

## 2018-10-08 DIAGNOSIS — K130 Diseases of lips: Secondary | ICD-10-CM | POA: Diagnosis not present

## 2018-10-08 DIAGNOSIS — D485 Neoplasm of uncertain behavior of skin: Secondary | ICD-10-CM | POA: Diagnosis not present

## 2018-10-08 DIAGNOSIS — L089 Local infection of the skin and subcutaneous tissue, unspecified: Secondary | ICD-10-CM | POA: Diagnosis not present

## 2018-10-11 DIAGNOSIS — I10 Essential (primary) hypertension: Secondary | ICD-10-CM | POA: Diagnosis not present

## 2018-10-11 DIAGNOSIS — M545 Low back pain: Secondary | ICD-10-CM | POA: Diagnosis not present

## 2018-10-24 DIAGNOSIS — G43709 Chronic migraine without aura, not intractable, without status migrainosus: Secondary | ICD-10-CM | POA: Diagnosis not present

## 2018-10-24 DIAGNOSIS — I10 Essential (primary) hypertension: Secondary | ICD-10-CM | POA: Diagnosis not present

## 2018-10-24 DIAGNOSIS — Z0000 Encounter for general adult medical examination without abnormal findings: Secondary | ICD-10-CM | POA: Diagnosis not present

## 2018-10-24 DIAGNOSIS — M545 Low back pain: Secondary | ICD-10-CM | POA: Diagnosis not present

## 2018-10-25 DIAGNOSIS — S83241A Other tear of medial meniscus, current injury, right knee, initial encounter: Secondary | ICD-10-CM | POA: Diagnosis not present

## 2018-10-25 DIAGNOSIS — Z96651 Presence of right artificial knee joint: Secondary | ICD-10-CM | POA: Diagnosis not present

## 2018-10-25 DIAGNOSIS — M25561 Pain in right knee: Secondary | ICD-10-CM | POA: Diagnosis not present

## 2018-11-07 DIAGNOSIS — S83241A Other tear of medial meniscus, current injury, right knee, initial encounter: Secondary | ICD-10-CM | POA: Diagnosis not present

## 2018-11-07 DIAGNOSIS — M1711 Unilateral primary osteoarthritis, right knee: Secondary | ICD-10-CM | POA: Diagnosis not present

## 2018-11-07 DIAGNOSIS — M25561 Pain in right knee: Secondary | ICD-10-CM | POA: Diagnosis not present

## 2018-11-12 DIAGNOSIS — M810 Age-related osteoporosis without current pathological fracture: Secondary | ICD-10-CM | POA: Diagnosis not present

## 2018-11-12 DIAGNOSIS — M85851 Other specified disorders of bone density and structure, right thigh: Secondary | ICD-10-CM | POA: Diagnosis not present

## 2018-11-30 DIAGNOSIS — I10 Essential (primary) hypertension: Secondary | ICD-10-CM | POA: Diagnosis not present

## 2018-11-30 DIAGNOSIS — M545 Low back pain: Secondary | ICD-10-CM | POA: Diagnosis not present

## 2018-12-21 DIAGNOSIS — I10 Essential (primary) hypertension: Secondary | ICD-10-CM | POA: Diagnosis not present

## 2018-12-21 DIAGNOSIS — M545 Low back pain: Secondary | ICD-10-CM | POA: Diagnosis not present

## 2018-12-24 DIAGNOSIS — M545 Low back pain: Secondary | ICD-10-CM | POA: Diagnosis not present

## 2018-12-24 DIAGNOSIS — I10 Essential (primary) hypertension: Secondary | ICD-10-CM | POA: Diagnosis not present

## 2018-12-24 DIAGNOSIS — G43709 Chronic migraine without aura, not intractable, without status migrainosus: Secondary | ICD-10-CM | POA: Diagnosis not present

## 2019-02-13 DIAGNOSIS — M545 Low back pain: Secondary | ICD-10-CM | POA: Diagnosis not present

## 2019-02-13 DIAGNOSIS — I10 Essential (primary) hypertension: Secondary | ICD-10-CM | POA: Diagnosis not present

## 2019-02-13 DIAGNOSIS — G43709 Chronic migraine without aura, not intractable, without status migrainosus: Secondary | ICD-10-CM | POA: Diagnosis not present

## 2019-03-12 DIAGNOSIS — I10 Essential (primary) hypertension: Secondary | ICD-10-CM | POA: Diagnosis not present

## 2019-03-12 DIAGNOSIS — M545 Low back pain: Secondary | ICD-10-CM | POA: Diagnosis not present

## 2019-03-12 DIAGNOSIS — G43709 Chronic migraine without aura, not intractable, without status migrainosus: Secondary | ICD-10-CM | POA: Diagnosis not present

## 2019-05-08 DIAGNOSIS — G43709 Chronic migraine without aura, not intractable, without status migrainosus: Secondary | ICD-10-CM | POA: Diagnosis not present

## 2019-05-08 DIAGNOSIS — M545 Low back pain: Secondary | ICD-10-CM | POA: Diagnosis not present

## 2019-05-08 DIAGNOSIS — I10 Essential (primary) hypertension: Secondary | ICD-10-CM | POA: Diagnosis not present

## 2019-05-13 DIAGNOSIS — I10 Essential (primary) hypertension: Secondary | ICD-10-CM | POA: Diagnosis not present

## 2019-05-13 DIAGNOSIS — G43709 Chronic migraine without aura, not intractable, without status migrainosus: Secondary | ICD-10-CM | POA: Diagnosis not present

## 2019-05-13 DIAGNOSIS — N181 Chronic kidney disease, stage 1: Secondary | ICD-10-CM | POA: Diagnosis not present

## 2019-05-13 DIAGNOSIS — M545 Low back pain: Secondary | ICD-10-CM | POA: Diagnosis not present

## 2019-07-11 DIAGNOSIS — I10 Essential (primary) hypertension: Secondary | ICD-10-CM | POA: Diagnosis not present

## 2019-07-11 DIAGNOSIS — N181 Chronic kidney disease, stage 1: Secondary | ICD-10-CM | POA: Diagnosis not present

## 2019-07-11 DIAGNOSIS — M5415 Radiculopathy, thoracolumbar region: Secondary | ICD-10-CM | POA: Diagnosis not present

## 2019-07-11 DIAGNOSIS — M545 Low back pain: Secondary | ICD-10-CM | POA: Diagnosis not present

## 2019-07-11 DIAGNOSIS — G43709 Chronic migraine without aura, not intractable, without status migrainosus: Secondary | ICD-10-CM | POA: Diagnosis not present

## 2019-08-09 DIAGNOSIS — N181 Chronic kidney disease, stage 1: Secondary | ICD-10-CM | POA: Diagnosis not present

## 2019-08-09 DIAGNOSIS — M545 Low back pain: Secondary | ICD-10-CM | POA: Diagnosis not present

## 2019-08-09 DIAGNOSIS — I10 Essential (primary) hypertension: Secondary | ICD-10-CM | POA: Diagnosis not present

## 2019-09-10 DIAGNOSIS — Z0000 Encounter for general adult medical examination without abnormal findings: Secondary | ICD-10-CM | POA: Diagnosis not present

## 2019-09-10 DIAGNOSIS — I10 Essential (primary) hypertension: Secondary | ICD-10-CM | POA: Diagnosis not present

## 2019-09-10 DIAGNOSIS — G43709 Chronic migraine without aura, not intractable, without status migrainosus: Secondary | ICD-10-CM | POA: Diagnosis not present

## 2019-09-10 DIAGNOSIS — Z1389 Encounter for screening for other disorder: Secondary | ICD-10-CM | POA: Diagnosis not present

## 2019-09-10 DIAGNOSIS — N181 Chronic kidney disease, stage 1: Secondary | ICD-10-CM | POA: Diagnosis not present

## 2019-09-10 DIAGNOSIS — M545 Low back pain: Secondary | ICD-10-CM | POA: Diagnosis not present

## 2019-11-13 DIAGNOSIS — I10 Essential (primary) hypertension: Secondary | ICD-10-CM | POA: Diagnosis not present

## 2019-11-13 DIAGNOSIS — M545 Low back pain: Secondary | ICD-10-CM | POA: Diagnosis not present

## 2019-11-13 DIAGNOSIS — N181 Chronic kidney disease, stage 1: Secondary | ICD-10-CM | POA: Diagnosis not present

## 2019-11-13 DIAGNOSIS — G43709 Chronic migraine without aura, not intractable, without status migrainosus: Secondary | ICD-10-CM | POA: Diagnosis not present

## 2019-12-10 DIAGNOSIS — G43709 Chronic migraine without aura, not intractable, without status migrainosus: Secondary | ICD-10-CM | POA: Diagnosis not present

## 2019-12-10 DIAGNOSIS — N181 Chronic kidney disease, stage 1: Secondary | ICD-10-CM | POA: Diagnosis not present

## 2019-12-10 DIAGNOSIS — M545 Low back pain: Secondary | ICD-10-CM | POA: Diagnosis not present

## 2019-12-10 DIAGNOSIS — I10 Essential (primary) hypertension: Secondary | ICD-10-CM | POA: Diagnosis not present

## 2019-12-26 DIAGNOSIS — M545 Low back pain: Secondary | ICD-10-CM | POA: Diagnosis not present

## 2019-12-26 DIAGNOSIS — N181 Chronic kidney disease, stage 1: Secondary | ICD-10-CM | POA: Diagnosis not present

## 2019-12-26 DIAGNOSIS — I10 Essential (primary) hypertension: Secondary | ICD-10-CM | POA: Diagnosis not present

## 2020-01-24 DIAGNOSIS — I10 Essential (primary) hypertension: Secondary | ICD-10-CM | POA: Diagnosis not present

## 2020-01-24 DIAGNOSIS — N181 Chronic kidney disease, stage 1: Secondary | ICD-10-CM | POA: Diagnosis not present

## 2020-01-24 DIAGNOSIS — M545 Low back pain: Secondary | ICD-10-CM | POA: Diagnosis not present

## 2020-02-10 DIAGNOSIS — Z0000 Encounter for general adult medical examination without abnormal findings: Secondary | ICD-10-CM | POA: Diagnosis not present

## 2020-02-10 DIAGNOSIS — M25572 Pain in left ankle and joints of left foot: Secondary | ICD-10-CM | POA: Diagnosis not present

## 2020-02-10 DIAGNOSIS — M545 Low back pain: Secondary | ICD-10-CM | POA: Diagnosis not present

## 2020-02-10 DIAGNOSIS — I10 Essential (primary) hypertension: Secondary | ICD-10-CM | POA: Diagnosis not present

## 2020-02-10 DIAGNOSIS — N181 Chronic kidney disease, stage 1: Secondary | ICD-10-CM | POA: Diagnosis not present

## 2020-02-10 DIAGNOSIS — G43709 Chronic migraine without aura, not intractable, without status migrainosus: Secondary | ICD-10-CM | POA: Diagnosis not present

## 2020-02-19 DIAGNOSIS — R52 Pain, unspecified: Secondary | ICD-10-CM | POA: Diagnosis not present

## 2020-02-19 DIAGNOSIS — M25571 Pain in right ankle and joints of right foot: Secondary | ICD-10-CM | POA: Diagnosis not present

## 2020-02-19 DIAGNOSIS — M799 Soft tissue disorder, unspecified: Secondary | ICD-10-CM | POA: Diagnosis not present

## 2020-02-20 ENCOUNTER — Ambulatory Visit: Payer: Self-pay | Admitting: Orthopaedic Surgery

## 2020-03-19 DIAGNOSIS — D6489 Other specified anemias: Secondary | ICD-10-CM | POA: Diagnosis not present

## 2020-03-19 DIAGNOSIS — D649 Anemia, unspecified: Secondary | ICD-10-CM | POA: Diagnosis not present

## 2020-04-09 DIAGNOSIS — M545 Low back pain: Secondary | ICD-10-CM | POA: Diagnosis not present

## 2020-04-09 DIAGNOSIS — I10 Essential (primary) hypertension: Secondary | ICD-10-CM | POA: Diagnosis not present

## 2020-04-13 DIAGNOSIS — N181 Chronic kidney disease, stage 1: Secondary | ICD-10-CM | POA: Diagnosis not present

## 2020-04-13 DIAGNOSIS — I10 Essential (primary) hypertension: Secondary | ICD-10-CM | POA: Diagnosis not present

## 2020-04-13 DIAGNOSIS — G43709 Chronic migraine without aura, not intractable, without status migrainosus: Secondary | ICD-10-CM | POA: Diagnosis not present

## 2020-04-13 DIAGNOSIS — M545 Low back pain: Secondary | ICD-10-CM | POA: Diagnosis not present

## 2020-06-11 DIAGNOSIS — N181 Chronic kidney disease, stage 1: Secondary | ICD-10-CM | POA: Diagnosis not present

## 2020-06-11 DIAGNOSIS — I10 Essential (primary) hypertension: Secondary | ICD-10-CM | POA: Diagnosis not present

## 2020-06-11 DIAGNOSIS — M545 Low back pain, unspecified: Secondary | ICD-10-CM | POA: Diagnosis not present

## 2020-06-11 DIAGNOSIS — M25572 Pain in left ankle and joints of left foot: Secondary | ICD-10-CM | POA: Diagnosis not present

## 2020-06-25 ENCOUNTER — Ambulatory Visit (INDEPENDENT_AMBULATORY_CARE_PROVIDER_SITE_OTHER): Payer: Self-pay | Admitting: Orthopaedic Surgery

## 2020-06-25 ENCOUNTER — Encounter: Payer: Self-pay | Admitting: Orthopaedic Surgery

## 2020-06-25 ENCOUNTER — Other Ambulatory Visit: Payer: Self-pay

## 2020-06-25 VITALS — BP 135/91 | HR 74 | Ht 65.0 in | Wt 250.0 lb

## 2020-06-25 DIAGNOSIS — M7661 Achilles tendinitis, right leg: Secondary | ICD-10-CM

## 2020-06-25 DIAGNOSIS — M2141 Flat foot [pes planus] (acquired), right foot: Secondary | ICD-10-CM

## 2020-06-25 DIAGNOSIS — Z6841 Body Mass Index (BMI) 40.0 and over, adult: Secondary | ICD-10-CM

## 2020-06-25 DIAGNOSIS — M25572 Pain in left ankle and joints of left foot: Secondary | ICD-10-CM

## 2020-06-25 DIAGNOSIS — M25571 Pain in right ankle and joints of right foot: Secondary | ICD-10-CM

## 2020-06-25 NOTE — Progress Notes (Signed)
Office Visit Note   Patient: Carrie Hansen           Date of Birth: 21-Oct-1960           MRN: [id number] Visit Date: 06/25/2020              Requested by: No referring provider defined for this encounter. PCP: No primary care provider on file.   Assessment & Plan: Visit Diagnoses:  1. Body mass index 40.0-44.9, adult ([place])   2. Achilles tendinitis, right leg   3. Pes planus of both feet     Plan: We discussed some inserts such as the super feet, Barry colored.  We discussed using shoes that have significant arch support. The patient meets the AMA guidelines for Morbid (severe) obesity with a BMI > 40.0 and I have recommended weight loss.   Follow-Up Instructions: Return if symptoms worsen or fail to improve.   Orders:  No orders of the defined types were placed in this encounter.  No orders of the defined types were placed in this encounter.     Procedures: No procedures performed   Clinical Data: No additional findings.   Subjective: Chief Complaint  Patient presents with  . Left Ankle - Pain  . Right Ankle - Pain    HPI 59 year old female referred by Dr.Hasanaj with bilateral ankle pain.  Patient has no known injury.  Patient states she is having right lateral ankle pain and left posterior ankle pain.  Patient has more pain on the right than the left.  Patient on occasion with walking just feels sharp pain.  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  She states at times it does not feel like mild swelling her ankles are related to the pain that she has.  Symptoms her foot feels like it is wants to give way.  Patient has discomfort posteriorly some problems with heels.  Patient is on chronic Percocet for her back.  She is on Percocet tens 90 tablets monthly with lumbar disc herniation surgery done by Dr. Lynann Bologna 10 years ago.  Review of Systems all other systems are negative is a pertains  HPI.   Objective: Vital Signs: BP (!) 135/91   Pulse 74   Ht 5\' 5"  (1.651 m)   Wt 250 lb (113.4 kg)   BMI 41.60 kg/m   Physical Exam Constitutional:      Appearance: She is well-developed.  HENT:     Head: Normocephalic.     Right Ear: External ear normal.     Left Ear: External ear normal.  Eyes:     Pupils: Pupils are equal, round, and reactive to light.  Neck:     Thyroid: No thyromegaly.     Trachea: No tracheal deviation.  Cardiovascular:     Rate and Rhythm: Normal rate.  Pulmonary:     Effort: Pulmonary effort is normal.  Abdominal:     Palpations: Abdomen is soft.  Skin:    General: Skin is warm and dry.  Neurological:     Mental Status: She is alert and oriented to person, place, and time.  Psychiatric:        Behavior: Behavior normal.     Ortho Exam patient has tenderness over the right Achilles tendon 2 to 3 cm from the insertion site without thickening.  Increased discomfort with toe walking.  Patient has bilateral pes planus with intact anterior posterior tib.  Specialty Comments:  No specialty comments available.  Imaging: No results found.   PMFS History: Patient Active Problem List   Diagnosis Date Noted  . Achilles tendinitis, right leg 06/28/2020  . Pes planus 06/28/2020   No past medical history on file.  No family history on file.   Social History   Occupational History  . Not on file  Tobacco Use  . Smoking status: Former Research scientist (life sciences)  . Smokeless tobacco: Never Used  Substance and Sexual Activity  . Alcohol use: Not Currently  . Drug use: Not on file  . Sexual activity: Not on file

## 2020-06-28 DIAGNOSIS — M7661 Achilles tendinitis, right leg: Secondary | ICD-10-CM | POA: Insufficient documentation

## 2020-06-28 DIAGNOSIS — M2140 Flat foot [pes planus] (acquired), unspecified foot: Secondary | ICD-10-CM | POA: Insufficient documentation

## 2020-08-12 DIAGNOSIS — G43709 Chronic migraine without aura, not intractable, without status migrainosus: Secondary | ICD-10-CM | POA: Diagnosis not present

## 2020-08-12 DIAGNOSIS — N181 Chronic kidney disease, stage 1: Secondary | ICD-10-CM | POA: Diagnosis not present

## 2020-08-12 DIAGNOSIS — I10 Essential (primary) hypertension: Secondary | ICD-10-CM | POA: Diagnosis not present

## 2020-08-12 DIAGNOSIS — M545 Low back pain, unspecified: Secondary | ICD-10-CM | POA: Diagnosis not present

## 2020-11-05 DIAGNOSIS — G43709 Chronic migraine without aura, not intractable, without status migrainosus: Secondary | ICD-10-CM | POA: Diagnosis not present

## 2020-11-05 DIAGNOSIS — N181 Chronic kidney disease, stage 1: Secondary | ICD-10-CM | POA: Diagnosis not present

## 2020-11-05 DIAGNOSIS — I10 Essential (primary) hypertension: Secondary | ICD-10-CM | POA: Diagnosis not present

## 2020-11-05 DIAGNOSIS — M545 Low back pain, unspecified: Secondary | ICD-10-CM | POA: Diagnosis not present

## 2020-11-05 DIAGNOSIS — Z0000 Encounter for general adult medical examination without abnormal findings: Secondary | ICD-10-CM | POA: Diagnosis not present

## 2021-01-26 DIAGNOSIS — Z23 Encounter for immunization: Secondary | ICD-10-CM | POA: Diagnosis not present

## 2021-01-26 DIAGNOSIS — S61012A Laceration without foreign body of left thumb without damage to nail, initial encounter: Secondary | ICD-10-CM | POA: Diagnosis not present

## 2021-01-26 DIAGNOSIS — I10 Essential (primary) hypertension: Secondary | ICD-10-CM | POA: Diagnosis not present

## 2021-01-26 DIAGNOSIS — S61412A Laceration without foreign body of left hand, initial encounter: Secondary | ICD-10-CM | POA: Diagnosis not present

## 2021-01-26 DIAGNOSIS — Z87891 Personal history of nicotine dependence: Secondary | ICD-10-CM | POA: Diagnosis not present

## 2021-01-26 DIAGNOSIS — Z9884 Bariatric surgery status: Secondary | ICD-10-CM | POA: Diagnosis not present

## 2021-01-26 DIAGNOSIS — X58XXXA Exposure to other specified factors, initial encounter: Secondary | ICD-10-CM | POA: Diagnosis not present

## 2021-02-04 DIAGNOSIS — M545 Low back pain, unspecified: Secondary | ICD-10-CM | POA: Diagnosis not present

## 2021-02-04 DIAGNOSIS — Z79891 Long term (current) use of opiate analgesic: Secondary | ICD-10-CM | POA: Diagnosis not present

## 2021-02-04 DIAGNOSIS — G43709 Chronic migraine without aura, not intractable, without status migrainosus: Secondary | ICD-10-CM | POA: Diagnosis not present

## 2021-02-04 DIAGNOSIS — I10 Essential (primary) hypertension: Secondary | ICD-10-CM | POA: Diagnosis not present

## 2021-02-04 DIAGNOSIS — N181 Chronic kidney disease, stage 1: Secondary | ICD-10-CM | POA: Diagnosis not present

## 2021-02-04 DIAGNOSIS — Z0000 Encounter for general adult medical examination without abnormal findings: Secondary | ICD-10-CM | POA: Diagnosis not present

## 2021-04-06 DIAGNOSIS — M545 Low back pain, unspecified: Secondary | ICD-10-CM | POA: Diagnosis not present

## 2021-04-06 DIAGNOSIS — G43709 Chronic migraine without aura, not intractable, without status migrainosus: Secondary | ICD-10-CM | POA: Diagnosis not present

## 2021-04-06 DIAGNOSIS — N181 Chronic kidney disease, stage 1: Secondary | ICD-10-CM | POA: Diagnosis not present

## 2021-04-06 DIAGNOSIS — I10 Essential (primary) hypertension: Secondary | ICD-10-CM | POA: Diagnosis not present

## 2021-06-09 DIAGNOSIS — G43709 Chronic migraine without aura, not intractable, without status migrainosus: Secondary | ICD-10-CM | POA: Diagnosis not present

## 2021-06-09 DIAGNOSIS — N181 Chronic kidney disease, stage 1: Secondary | ICD-10-CM | POA: Diagnosis not present

## 2021-06-09 DIAGNOSIS — M545 Low back pain, unspecified: Secondary | ICD-10-CM | POA: Diagnosis not present

## 2021-06-09 DIAGNOSIS — I10 Essential (primary) hypertension: Secondary | ICD-10-CM | POA: Diagnosis not present

## 2021-08-24 DIAGNOSIS — I10 Essential (primary) hypertension: Secondary | ICD-10-CM | POA: Diagnosis not present

## 2021-08-24 DIAGNOSIS — M545 Low back pain, unspecified: Secondary | ICD-10-CM | POA: Diagnosis not present

## 2021-08-24 DIAGNOSIS — Z0000 Encounter for general adult medical examination without abnormal findings: Secondary | ICD-10-CM | POA: Diagnosis not present

## 2021-08-24 DIAGNOSIS — G43709 Chronic migraine without aura, not intractable, without status migrainosus: Secondary | ICD-10-CM | POA: Diagnosis not present

## 2021-08-24 DIAGNOSIS — N181 Chronic kidney disease, stage 1: Secondary | ICD-10-CM | POA: Diagnosis not present

## 2021-08-24 DIAGNOSIS — I872 Venous insufficiency (chronic) (peripheral): Secondary | ICD-10-CM | POA: Diagnosis not present

## 2021-10-26 DIAGNOSIS — Z0000 Encounter for general adult medical examination without abnormal findings: Secondary | ICD-10-CM | POA: Diagnosis not present

## 2021-10-26 DIAGNOSIS — M545 Low back pain, unspecified: Secondary | ICD-10-CM | POA: Diagnosis not present

## 2021-10-26 DIAGNOSIS — G43709 Chronic migraine without aura, not intractable, without status migrainosus: Secondary | ICD-10-CM | POA: Diagnosis not present

## 2021-10-26 DIAGNOSIS — I10 Essential (primary) hypertension: Secondary | ICD-10-CM | POA: Diagnosis not present

## 2021-10-26 DIAGNOSIS — I872 Venous insufficiency (chronic) (peripheral): Secondary | ICD-10-CM | POA: Diagnosis not present

## 2021-10-26 DIAGNOSIS — N181 Chronic kidney disease, stage 1: Secondary | ICD-10-CM | POA: Diagnosis not present

## 2021-12-13 DIAGNOSIS — I10 Essential (primary) hypertension: Secondary | ICD-10-CM | POA: Diagnosis not present

## 2021-12-13 DIAGNOSIS — F32A Depression, unspecified: Secondary | ICD-10-CM | POA: Diagnosis not present

## 2021-12-13 DIAGNOSIS — Z888 Allergy status to other drugs, medicaments and biological substances status: Secondary | ICD-10-CM | POA: Diagnosis not present

## 2021-12-13 DIAGNOSIS — W228XXA Striking against or struck by other objects, initial encounter: Secondary | ICD-10-CM | POA: Diagnosis not present

## 2021-12-13 DIAGNOSIS — S92522A Displaced fracture of medial phalanx of left lesser toe(s), initial encounter for closed fracture: Secondary | ICD-10-CM | POA: Diagnosis not present

## 2021-12-13 DIAGNOSIS — S92514A Nondisplaced fracture of proximal phalanx of right lesser toe(s), initial encounter for closed fracture: Secondary | ICD-10-CM | POA: Diagnosis not present

## 2021-12-13 DIAGNOSIS — M7989 Other specified soft tissue disorders: Secondary | ICD-10-CM | POA: Diagnosis not present

## 2021-12-13 DIAGNOSIS — M1990 Unspecified osteoarthritis, unspecified site: Secondary | ICD-10-CM | POA: Diagnosis not present

## 2021-12-13 DIAGNOSIS — Z87891 Personal history of nicotine dependence: Secondary | ICD-10-CM | POA: Diagnosis not present

## 2021-12-28 DIAGNOSIS — I10 Essential (primary) hypertension: Secondary | ICD-10-CM | POA: Diagnosis not present

## 2021-12-28 DIAGNOSIS — S92421A Displaced fracture of distal phalanx of right great toe, initial encounter for closed fracture: Secondary | ICD-10-CM | POA: Diagnosis not present

## 2022-03-22 DIAGNOSIS — N181 Chronic kidney disease, stage 1: Secondary | ICD-10-CM | POA: Diagnosis not present

## 2022-03-22 DIAGNOSIS — I10 Essential (primary) hypertension: Secondary | ICD-10-CM | POA: Diagnosis not present

## 2022-03-22 DIAGNOSIS — M545 Low back pain, unspecified: Secondary | ICD-10-CM | POA: Diagnosis not present

## 2022-06-01 DIAGNOSIS — I10 Essential (primary) hypertension: Secondary | ICD-10-CM | POA: Diagnosis not present

## 2022-06-01 DIAGNOSIS — M545 Low back pain, unspecified: Secondary | ICD-10-CM | POA: Diagnosis not present

## 2022-08-04 DIAGNOSIS — M545 Low back pain, unspecified: Secondary | ICD-10-CM | POA: Diagnosis not present

## 2022-08-04 DIAGNOSIS — I10 Essential (primary) hypertension: Secondary | ICD-10-CM | POA: Diagnosis not present

## 2022-10-31 DIAGNOSIS — M545 Low back pain, unspecified: Secondary | ICD-10-CM | POA: Diagnosis not present

## 2022-10-31 DIAGNOSIS — I10 Essential (primary) hypertension: Secondary | ICD-10-CM | POA: Diagnosis not present

## 2022-10-31 DIAGNOSIS — Z0000 Encounter for general adult medical examination without abnormal findings: Secondary | ICD-10-CM | POA: Diagnosis not present

## 2022-10-31 DIAGNOSIS — L309 Dermatitis, unspecified: Secondary | ICD-10-CM | POA: Diagnosis not present

## 2022-12-29 DIAGNOSIS — I10 Essential (primary) hypertension: Secondary | ICD-10-CM | POA: Diagnosis not present

## 2022-12-29 DIAGNOSIS — M545 Low back pain, unspecified: Secondary | ICD-10-CM | POA: Diagnosis not present

## 2022-12-29 DIAGNOSIS — Z0000 Encounter for general adult medical examination without abnormal findings: Secondary | ICD-10-CM | POA: Diagnosis not present

## 2022-12-29 DIAGNOSIS — L309 Dermatitis, unspecified: Secondary | ICD-10-CM | POA: Diagnosis not present

## 2022-12-29 DIAGNOSIS — J4540 Moderate persistent asthma, uncomplicated: Secondary | ICD-10-CM | POA: Diagnosis not present

## 2023-01-09 DIAGNOSIS — Z1231 Encounter for screening mammogram for malignant neoplasm of breast: Secondary | ICD-10-CM | POA: Diagnosis not present

## 2023-03-01 DIAGNOSIS — N182 Chronic kidney disease, stage 2 (mild): Secondary | ICD-10-CM | POA: Diagnosis not present

## 2023-03-01 DIAGNOSIS — L309 Dermatitis, unspecified: Secondary | ICD-10-CM | POA: Diagnosis not present

## 2023-03-01 DIAGNOSIS — M545 Low back pain, unspecified: Secondary | ICD-10-CM | POA: Diagnosis not present

## 2023-03-01 DIAGNOSIS — J4540 Moderate persistent asthma, uncomplicated: Secondary | ICD-10-CM | POA: Diagnosis not present

## 2023-03-01 DIAGNOSIS — I10 Essential (primary) hypertension: Secondary | ICD-10-CM | POA: Diagnosis not present

## 2023-05-31 DIAGNOSIS — L309 Dermatitis, unspecified: Secondary | ICD-10-CM | POA: Diagnosis not present

## 2023-05-31 DIAGNOSIS — J440 Chronic obstructive pulmonary disease with acute lower respiratory infection: Secondary | ICD-10-CM | POA: Diagnosis not present

## 2023-05-31 DIAGNOSIS — M545 Low back pain, unspecified: Secondary | ICD-10-CM | POA: Diagnosis not present

## 2023-05-31 DIAGNOSIS — I10 Essential (primary) hypertension: Secondary | ICD-10-CM | POA: Diagnosis not present

## 2023-05-31 DIAGNOSIS — J4540 Moderate persistent asthma, uncomplicated: Secondary | ICD-10-CM | POA: Diagnosis not present

## 2023-05-31 DIAGNOSIS — N182 Chronic kidney disease, stage 2 (mild): Secondary | ICD-10-CM | POA: Diagnosis not present

## 2023-05-31 DIAGNOSIS — R7303 Prediabetes: Secondary | ICD-10-CM | POA: Diagnosis not present

## 2023-06-29 DIAGNOSIS — Z2914 Encounter for prophylactic rabies immune globin: Secondary | ICD-10-CM | POA: Diagnosis not present

## 2023-06-29 DIAGNOSIS — M85831 Other specified disorders of bone density and structure, right forearm: Secondary | ICD-10-CM | POA: Diagnosis not present

## 2023-06-29 DIAGNOSIS — Z23 Encounter for immunization: Secondary | ICD-10-CM | POA: Diagnosis not present

## 2023-06-29 DIAGNOSIS — Z203 Contact with and (suspected) exposure to rabies: Secondary | ICD-10-CM | POA: Diagnosis not present

## 2023-06-29 DIAGNOSIS — S51819A Laceration without foreign body of unspecified forearm, initial encounter: Secondary | ICD-10-CM | POA: Diagnosis not present

## 2023-06-29 DIAGNOSIS — W540XXA Bitten by dog, initial encounter: Secondary | ICD-10-CM | POA: Diagnosis not present

## 2023-06-29 DIAGNOSIS — F17200 Nicotine dependence, unspecified, uncomplicated: Secondary | ICD-10-CM | POA: Diagnosis not present

## 2023-06-29 DIAGNOSIS — S56921A Laceration of unspecified muscles, fascia and tendons at forearm level, right arm, initial encounter: Secondary | ICD-10-CM | POA: Diagnosis not present

## 2023-06-29 DIAGNOSIS — S61511A Laceration without foreign body of right wrist, initial encounter: Secondary | ICD-10-CM | POA: Diagnosis not present

## 2023-07-01 DIAGNOSIS — J440 Chronic obstructive pulmonary disease with acute lower respiratory infection: Secondary | ICD-10-CM | POA: Diagnosis not present

## 2023-07-02 DIAGNOSIS — S61552D Open bite of left wrist, subsequent encounter: Secondary | ICD-10-CM | POA: Diagnosis not present

## 2023-07-02 DIAGNOSIS — Z203 Contact with and (suspected) exposure to rabies: Secondary | ICD-10-CM | POA: Diagnosis not present

## 2023-07-02 DIAGNOSIS — Z2914 Encounter for prophylactic rabies immune globin: Secondary | ICD-10-CM | POA: Diagnosis not present

## 2023-07-07 DIAGNOSIS — Z203 Contact with and (suspected) exposure to rabies: Secondary | ICD-10-CM | POA: Diagnosis not present

## 2023-07-07 DIAGNOSIS — S61552D Open bite of left wrist, subsequent encounter: Secondary | ICD-10-CM | POA: Diagnosis not present

## 2023-07-07 DIAGNOSIS — Z23 Encounter for immunization: Secondary | ICD-10-CM | POA: Diagnosis not present

## 2023-07-13 DIAGNOSIS — Z203 Contact with and (suspected) exposure to rabies: Secondary | ICD-10-CM | POA: Diagnosis not present

## 2023-07-13 DIAGNOSIS — Z23 Encounter for immunization: Secondary | ICD-10-CM | POA: Diagnosis not present

## 2023-07-13 DIAGNOSIS — S61552D Open bite of left wrist, subsequent encounter: Secondary | ICD-10-CM | POA: Diagnosis not present

## 2023-07-31 DIAGNOSIS — J440 Chronic obstructive pulmonary disease with acute lower respiratory infection: Secondary | ICD-10-CM | POA: Diagnosis not present

## 2023-09-06 DIAGNOSIS — N182 Chronic kidney disease, stage 2 (mild): Secondary | ICD-10-CM | POA: Diagnosis not present

## 2023-09-06 DIAGNOSIS — J4540 Moderate persistent asthma, uncomplicated: Secondary | ICD-10-CM | POA: Diagnosis not present

## 2023-09-06 DIAGNOSIS — E282 Polycystic ovarian syndrome: Secondary | ICD-10-CM | POA: Diagnosis not present

## 2023-09-06 DIAGNOSIS — M545 Low back pain, unspecified: Secondary | ICD-10-CM | POA: Diagnosis not present

## 2023-09-06 DIAGNOSIS — Z6841 Body Mass Index (BMI) 40.0 and over, adult: Secondary | ICD-10-CM | POA: Diagnosis not present

## 2023-09-06 DIAGNOSIS — I10 Essential (primary) hypertension: Secondary | ICD-10-CM | POA: Diagnosis not present

## 2023-12-06 DIAGNOSIS — I10 Essential (primary) hypertension: Secondary | ICD-10-CM | POA: Diagnosis not present

## 2023-12-06 DIAGNOSIS — J4540 Moderate persistent asthma, uncomplicated: Secondary | ICD-10-CM | POA: Diagnosis not present

## 2023-12-06 DIAGNOSIS — Z0000 Encounter for general adult medical examination without abnormal findings: Secondary | ICD-10-CM | POA: Diagnosis not present

## 2023-12-06 DIAGNOSIS — Z6841 Body Mass Index (BMI) 40.0 and over, adult: Secondary | ICD-10-CM | POA: Diagnosis not present

## 2023-12-06 DIAGNOSIS — N182 Chronic kidney disease, stage 2 (mild): Secondary | ICD-10-CM | POA: Diagnosis not present

## 2023-12-06 DIAGNOSIS — E282 Polycystic ovarian syndrome: Secondary | ICD-10-CM | POA: Diagnosis not present

## 2023-12-06 DIAGNOSIS — M545 Low back pain, unspecified: Secondary | ICD-10-CM | POA: Diagnosis not present

## 2024-01-25 DIAGNOSIS — R252 Cramp and spasm: Secondary | ICD-10-CM | POA: Diagnosis not present

## 2024-01-25 DIAGNOSIS — M79644 Pain in right finger(s): Secondary | ICD-10-CM | POA: Diagnosis not present

## 2024-01-25 DIAGNOSIS — Z6837 Body mass index (BMI) 37.0-37.9, adult: Secondary | ICD-10-CM | POA: Diagnosis not present

## 2024-01-26 DIAGNOSIS — R5383 Other fatigue: Secondary | ICD-10-CM | POA: Diagnosis not present

## 2024-01-26 DIAGNOSIS — I10 Essential (primary) hypertension: Secondary | ICD-10-CM | POA: Diagnosis not present

## 2024-01-26 DIAGNOSIS — R252 Cramp and spasm: Secondary | ICD-10-CM | POA: Diagnosis not present

## 2024-01-26 DIAGNOSIS — I499 Cardiac arrhythmia, unspecified: Secondary | ICD-10-CM | POA: Diagnosis not present

## 2024-01-26 DIAGNOSIS — M62838 Other muscle spasm: Secondary | ICD-10-CM | POA: Diagnosis not present

## 2024-01-26 DIAGNOSIS — Z79899 Other long term (current) drug therapy: Secondary | ICD-10-CM | POA: Diagnosis not present

## 2024-01-26 DIAGNOSIS — Z90710 Acquired absence of both cervix and uterus: Secondary | ICD-10-CM | POA: Diagnosis not present

## 2024-01-26 DIAGNOSIS — Z87891 Personal history of nicotine dependence: Secondary | ICD-10-CM | POA: Diagnosis not present

## 2024-01-26 DIAGNOSIS — M25531 Pain in right wrist: Secondary | ICD-10-CM | POA: Diagnosis not present

## 2024-01-26 DIAGNOSIS — R6889 Other general symptoms and signs: Secondary | ICD-10-CM | POA: Diagnosis not present

## 2024-01-26 DIAGNOSIS — Z9884 Bariatric surgery status: Secondary | ICD-10-CM | POA: Diagnosis not present

## 2024-01-26 DIAGNOSIS — E782 Mixed hyperlipidemia: Secondary | ICD-10-CM | POA: Diagnosis not present

## 2024-03-18 DIAGNOSIS — M545 Low back pain, unspecified: Secondary | ICD-10-CM | POA: Diagnosis not present

## 2024-03-18 DIAGNOSIS — M25531 Pain in right wrist: Secondary | ICD-10-CM | POA: Diagnosis not present

## 2024-03-18 DIAGNOSIS — I10 Essential (primary) hypertension: Secondary | ICD-10-CM | POA: Diagnosis not present

## 2024-03-18 DIAGNOSIS — J4540 Moderate persistent asthma, uncomplicated: Secondary | ICD-10-CM | POA: Diagnosis not present

## 2024-03-18 DIAGNOSIS — Z6837 Body mass index (BMI) 37.0-37.9, adult: Secondary | ICD-10-CM | POA: Diagnosis not present

## 2024-03-18 DIAGNOSIS — N1832 Chronic kidney disease, stage 3b: Secondary | ICD-10-CM | POA: Diagnosis not present

## 2024-03-18 DIAGNOSIS — Z0000 Encounter for general adult medical examination without abnormal findings: Secondary | ICD-10-CM | POA: Diagnosis not present

## 2024-03-20 ENCOUNTER — Encounter (INDEPENDENT_AMBULATORY_CARE_PROVIDER_SITE_OTHER): Payer: Self-pay | Admitting: *Deleted

## 2024-03-20 DIAGNOSIS — I739 Peripheral vascular disease, unspecified: Secondary | ICD-10-CM | POA: Diagnosis not present

## 2024-04-15 DIAGNOSIS — Z791 Long term (current) use of non-steroidal anti-inflammatories (NSAID): Secondary | ICD-10-CM | POA: Diagnosis not present

## 2024-04-15 DIAGNOSIS — F32A Depression, unspecified: Secondary | ICD-10-CM | POA: Diagnosis not present

## 2024-04-15 DIAGNOSIS — Z79899 Other long term (current) drug therapy: Secondary | ICD-10-CM | POA: Diagnosis not present

## 2024-04-15 DIAGNOSIS — K130 Diseases of lips: Secondary | ICD-10-CM | POA: Diagnosis not present

## 2024-04-15 DIAGNOSIS — I10 Essential (primary) hypertension: Secondary | ICD-10-CM | POA: Diagnosis not present

## 2024-04-15 DIAGNOSIS — Z87891 Personal history of nicotine dependence: Secondary | ICD-10-CM | POA: Diagnosis not present

## 2024-04-15 DIAGNOSIS — Z90710 Acquired absence of both cervix and uterus: Secondary | ICD-10-CM | POA: Diagnosis not present

## 2024-04-15 DIAGNOSIS — Z91048 Other nonmedicinal substance allergy status: Secondary | ICD-10-CM | POA: Diagnosis not present

## 2024-04-23 DIAGNOSIS — M654 Radial styloid tenosynovitis [de Quervain]: Secondary | ICD-10-CM | POA: Diagnosis not present

## 2024-05-09 DIAGNOSIS — Z780 Asymptomatic menopausal state: Secondary | ICD-10-CM | POA: Diagnosis not present

## 2024-06-25 DIAGNOSIS — R0683 Snoring: Secondary | ICD-10-CM | POA: Diagnosis not present

## 2024-06-25 DIAGNOSIS — M545 Low back pain, unspecified: Secondary | ICD-10-CM | POA: Diagnosis not present

## 2024-06-25 DIAGNOSIS — I10 Essential (primary) hypertension: Secondary | ICD-10-CM | POA: Diagnosis not present

## 2024-06-25 DIAGNOSIS — N1832 Chronic kidney disease, stage 3b: Secondary | ICD-10-CM | POA: Diagnosis not present

## 2024-06-25 DIAGNOSIS — J4540 Moderate persistent asthma, uncomplicated: Secondary | ICD-10-CM | POA: Diagnosis not present

## 2024-06-25 DIAGNOSIS — Z6837 Body mass index (BMI) 37.0-37.9, adult: Secondary | ICD-10-CM | POA: Diagnosis not present
